# Patient Record
Sex: Male | Born: 1970 | Hispanic: Yes | Marital: Married | Smoking: Never smoker
Health system: Southern US, Community
[De-identification: ages and names within clinical notes are randomized; demographics above are authoritative.]

---

## 2016-03-13 ENCOUNTER — Emergency Department (HOSPITAL_COMMUNITY): Payer: Self-pay

## 2016-03-13 ENCOUNTER — Emergency Department (HOSPITAL_COMMUNITY)
Admission: EM | Admit: 2016-03-13 | Discharge: 2016-03-14 | Disposition: A | Discharge status: Home / Self Care | Payer: Self-pay | Attending: Emergency Medicine | Admitting: Emergency Medicine

## 2016-03-13 ENCOUNTER — Encounter (HOSPITAL_COMMUNITY): Payer: Self-pay | Admitting: Emergency Medicine

## 2016-03-13 DIAGNOSIS — R109 Unspecified abdominal pain: Secondary | ICD-10-CM

## 2016-03-13 DIAGNOSIS — N3000 Acute cystitis without hematuria: Secondary | ICD-10-CM | POA: Insufficient documentation

## 2016-03-13 LAB — COMPREHENSIVE METABOLIC PANEL
ALBUMIN: 4.8 g/dL (ref 3.5–5.0)
ALK PHOS: 87 U/L (ref 38–126)
ALT: 27 U/L (ref 17–63)
AST: 43 U/L — AB (ref 15–41)
Anion gap: 10 (ref 5–15)
BILIRUBIN TOTAL: 1.1 mg/dL (ref 0.3–1.2)
BUN: 38 mg/dL — AB (ref 6–20)
CO2: 22 mmol/L (ref 22–32)
CREATININE: 1.95 mg/dL — AB (ref 0.61–1.24)
Calcium: 10.3 mg/dL (ref 8.9–10.3)
Chloride: 101 mmol/L (ref 101–111)
GFR calc Af Amer: 45 mL/min — ABNORMAL LOW (ref >60)
GFR, EST NON AFRICAN AMERICAN: 39 mL/min — AB (ref >60)
GLUCOSE: 112 mg/dL — AB (ref 65–99)
POTASSIUM: 4.3 mmol/L (ref 3.5–5.1)
Sodium: 133 mmol/L — ABNORMAL LOW (ref 135–145)
TOTAL PROTEIN: 8.8 g/dL — AB (ref 6.5–8.1)

## 2016-03-13 LAB — CBC
HEMATOCRIT: 51.2 % (ref 39.0–52.0)
Hemoglobin: 17.5 g/dL — ABNORMAL HIGH (ref 13.0–17.0)
MCH: 29.2 pg (ref 26.0–34.0)
MCHC: 34.2 g/dL (ref 30.0–36.0)
MCV: 85.5 fL (ref 78.0–100.0)
PLATELETS: 340 10*3/uL (ref 150–400)
RBC: 5.99 MIL/uL — ABNORMAL HIGH (ref 4.22–5.81)
RDW: 13 % (ref 11.5–15.5)
WBC: 10.2 10*3/uL (ref 4.0–10.5)

## 2016-03-13 LAB — URINE MICROSCOPIC-ADD ON

## 2016-03-13 LAB — LIPASE, BLOOD: Lipase: 26 U/L (ref 11–51)

## 2016-03-13 LAB — URINALYSIS, ROUTINE W REFLEX MICROSCOPIC
GLUCOSE, UA: NEGATIVE mg/dL
Hgb urine dipstick: NEGATIVE
KETONES UR: NEGATIVE mg/dL
Leukocytes, UA: NEGATIVE
NITRITE: NEGATIVE
PH: 5.5 (ref 5.0–8.0)
PROTEIN: 30 mg/dL — AB
Specific Gravity, Urine: 1.031 — ABNORMAL HIGH (ref 1.005–1.030)

## 2016-03-13 LAB — I-STAT TROPONIN, ED: Troponin i, poc: 0 ng/mL (ref 0.00–0.08)

## 2016-03-13 MED ORDER — HYDROMORPHONE HCL 1 MG/ML IJ SOLN
1.0000 mg | Freq: Once | INTRAMUSCULAR | Status: AC
  Administered 2016-03-13: 1 mg via INTRAVENOUS
  Filled 2016-03-13: qty 1

## 2016-03-13 MED ORDER — DEXTROSE 5 % IV SOLN
1.0000 g | Freq: Once | INTRAVENOUS | Status: AC
  Administered 2016-03-13: 1 g via INTRAVENOUS
  Filled 2016-03-13: qty 10

## 2016-03-13 NOTE — ED Triage Notes (Signed)
Rt sided abd pain x 3 days  No vomiting or diarrhea ,  Denies dysuria

## 2016-03-14 MED ORDER — CIPROFLOXACIN HCL 500 MG PO TABS: 500.0000 mg | ORAL_TABLET | Freq: Two times a day (BID) | ORAL | 0 refills | Status: AC

## 2016-03-14 MED ORDER — CIPROFLOXACIN HCL 500 MG PO TABS: 500.0000 mg | ORAL_TABLET | Freq: Two times a day (BID) | ORAL | 0 refills | Status: DC

## 2016-03-14 NOTE — ED Provider Notes (Addendum)
[facility]-EMERGENCY DEPT Provider Note   CSN: [id number] Arrival date & time: 03/13/16  1403     History   Chief Complaint Chief Complaint  Patient presents with  . Abdominal Pain    HPI Xavier Maxwell is a 45 y.o. male.   Abdominal Pain     Patient presents the emergency department with worsening right-sided abdominal pain with some radiation towards his right flank today.  He's adamantly had these symptoms.  He does report some dysuria without urinary frequency.  No history of urinary tract infection.  Denies vomiting and diarrhea.  No history of kidney stones.  No fevers or chills.  No chest pain or shortness of breath.  Pain is moderate in severity.     History reviewed. No pertinent past medical history.  There are no active problems to display for this patient.   History reviewed. No pertinent surgical history.     Home Medications    Prior to Admission medications   Medication Sig Start Date End Date Taking? Authorizing Provider  acetaminophen (TYLENOL) 500 MG tablet Take 500-1,000 mg by mouth every 6 (six) hours as needed for mild pain or moderate pain.   Yes Historical Provider, MD  ciprofloxacin (CIPRO) 500 MG tablet Take 1 tablet (500 mg total) by mouth 2 (two) times daily. 03/14/16   Azalia Bilis, MD    Family History No family history on file.  Social History Social History  Substance Use Topics  . Smoking status: Never Smoker  . Smokeless tobacco: Never Used  . Alcohol use Not on file     Allergies   Review of patient's allergies indicates no known allergies.   Review of Systems Review of Systems  Gastrointestinal: Positive for abdominal pain.  All other systems reviewed and are negative.    Physical Exam Updated Vital Signs BP 127/85   Pulse 76   Temp 98 F (36.7 C) (Oral)   Resp 12   Ht 5\' 6"  (1.676 m)   Wt 180 lb (81.6 kg)   SpO2 98%   BMI 29.05 kg/m   Physical Exam  Constitutional: He is oriented to person, place, and time.  He appears well-developed and well-nourished.  HENT:  Head: Normocephalic and atraumatic.  Eyes: EOM are normal.  Neck: Normal range of motion.  Cardiovascular: Normal rate, regular rhythm, normal heart sounds and intact distal pulses.   Pulmonary/Chest: Effort normal and breath sounds normal. No respiratory distress.  Abdominal: Soft. He exhibits no distension.  Mild right-sided abdominal tenderness without guarding or rebound  Musculoskeletal: Normal range of motion.  Neurological: He is alert and oriented to person, place, and time.  Skin: Skin is warm and dry.  Psychiatric: He has a normal mood and affect. Judgment normal.  Nursing note and vitals reviewed.    ED Treatments / Results  Labs (all labs ordered are listed, but only abnormal results are displayed) Labs Reviewed  COMPREHENSIVE METABOLIC PANEL - Abnormal; Notable for the following:       Result Value   Sodium 133 (*)    Glucose, Bld 112 (*)    BUN 38 (*)    Creatinine, Ser 1.95 (*)    Total Protein 8.8 (*)    AST 43 (*)    GFR calc non Af Amer 39 (*)    GFR calc Af Amer 45 (*)    All other components within normal limits  CBC - Abnormal; Notable for the following:    RBC 5.99 (*)    Hemoglobin 17.5 (*)  All other components within normal limits  URINALYSIS, ROUTINE W REFLEX MICROSCOPIC (NOT AT [facility]) - Abnormal; Notable for the following:    APPearance CLOUDY (*)    Specific Gravity, Urine 1.031 (*)    Bilirubin Urine SMALL (*)    Protein, ur 30 (*)    All other components within normal limits  URINE MICROSCOPIC-ADD ON - Abnormal; Notable for the following:    Squamous Epithelial / LPF 0-5 (*)    Bacteria, UA RARE (*)    Casts HYALINE CASTS (*)    All other components within normal limits  URINE CULTURE  LIPASE, BLOOD  I-STAT TROPOININ, ED    EKG  EKG Interpretation  Date/Time:  Tuesday March 13 2016 14:25:40 EDT Ventricular Rate:  92 PR Interval:  142 QRS Duration: 96 QT  Interval:  356 QTC Calculation: 440 R Axis:   76 Text Interpretation:  Normal sinus rhythm Normal ECG No old tracing to compare Confirmed by Nyisha Clippard  MD, Caryn BeeKEVIN ([id number]) on 03/13/2016 8:31:14 PM       Radiology Ct Renal Stone Study  Result Date: 03/13/2016 CLINICAL DATA:  Right flank pain for the last month. Worsening over the last 2-3 days. EXAM: CT ABDOMEN AND PELVIS WITHOUT CONTRAST TECHNIQUE: Multidetector CT imaging of the abdomen and pelvis was performed following the standard protocol without IV contrast. COMPARISON:  None. FINDINGS: Lower chest: Lung bases are clear. No pleural or pericardial fluid. Insignificant granuloma in the right middle lobe. Hepatobiliary: The liver is normal without contrast. No calcified gallstones. Pancreas: Normal Spleen: Normal Adrenals/Urinary Tract: Adrenal glands are normal. Kidneys are normal. No cyst, mass, stone or hydronephrosis. Stomach/Bowel: Normal. Normal appearing appendix. No evidence of diverticulosis or diverticulitis. Vascular/Lymphatic: Normal aorta and IVC. No retroperitoneal adenopathy. Reproductive: Normal Other: None. 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 IMPRESSION: No cause of acute pain identified. No abdominal organ pathology. 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 Electronically Signed   By: Paulina FusiMark  Shogry M.D.   On: 03/13/2016 21:56    Procedures Procedures (including critical care time)  Medications Ordered in ED Medications  cefTRIAXone (ROCEPHIN) 1 g in dextrose 5 % 50 mL IVPB (0 g Intravenous Stopped 03/13/16 2352)  HYDROmorphone (DILAUDID) injection 1 mg (1 mg Intravenous Given 03/13/16 2105)     Initial Impression / Assessment and Plan / ED Course  I have reviewed the triage vital signs and the nursing notes.  Pertinent labs & imaging results that were available during my care of the patient were reviewed by me and considered in my medical decision making  (see chart for details).  Clinical Course    Patient is overall well-appearing.  CT scan of his abdomen demonstrates no ureterolithiasis.  He does have a urinary tract infection and he has had some decreased oral intake today.  I suspect this is why the mild elevation in his BUN and creatinine.  No baseline studies available.  IV fluids.  IV antibiotics.  Urine culture sent.  [facility]  He understands to return to the ER for new or worsening symptoms.  Feels much better at this time   Final Clinical Impressions(s) / ED Diagnoses   Final diagnoses:  Abdominal pain, unspecified abdominal location  Acute cystitis without hematuria    New Prescriptions New Prescriptions   CIPROFLOXACIN (CIPRO) 500 MG TABLET    Take 1 tablet (500 mg total) by mouth 2 (two) times daily.  Azalia Bilis, MD 03/14/16 9604    Azalia Bilis, MD 05/10/16 [phone]

## 2016-03-15 LAB — URINE CULTURE: Culture: <10000 — AB

## 2018-02-25 IMAGING — CT CT RENAL STONE PROTOCOL
2 of 4 series · 10 of 46 positions shown, 11 images · non-contrast
Comparison: None.

CLINICAL DATA: Right flank pain for the last month. Worsening over
the last 2-3 days.

EXAM:
CT ABDOMEN AND PELVIS WITHOUT CONTRAST
TECHNIQUE: Multidetector CT imaging of the abdomen and pelvis was performed
following the standard protocol without IV contrast.

[Series 201: stone study, idose (2) · axial · 0.85mm/px · z∈[+347,+757]mm · 7 of 100 slices shown, 8 images]
[im 9/100  soft-tissue]
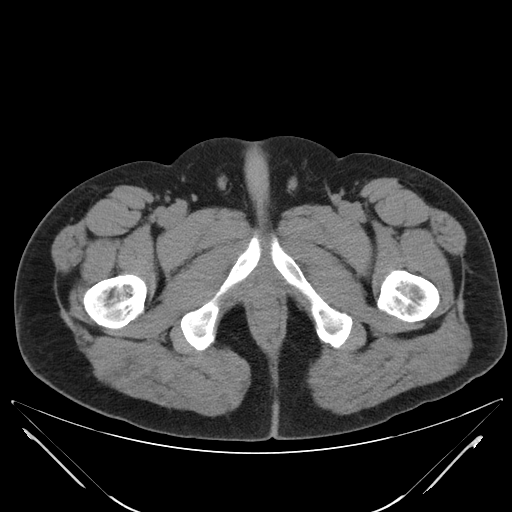
[im 9/100  bone]
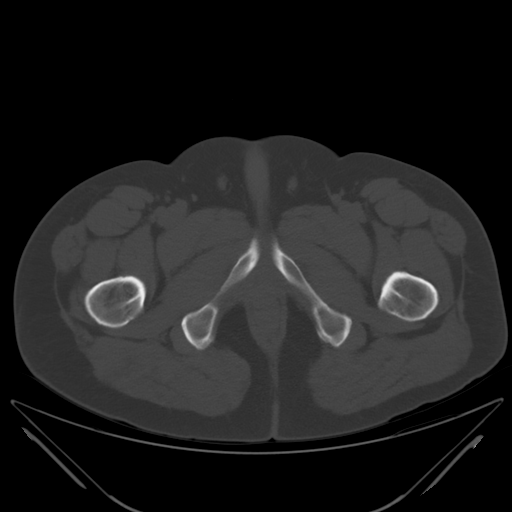
[im 22/100  soft-tissue]
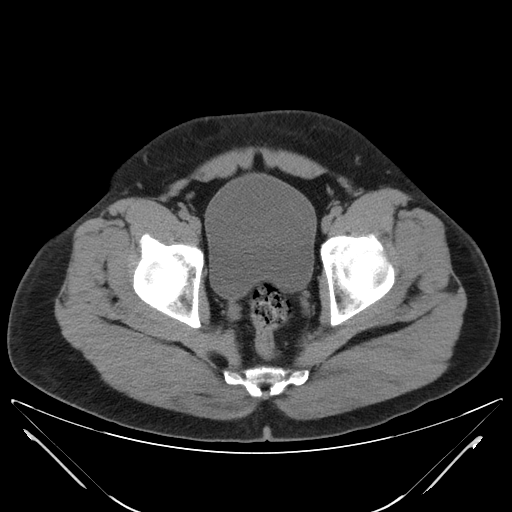
[im 35/100  soft-tissue]
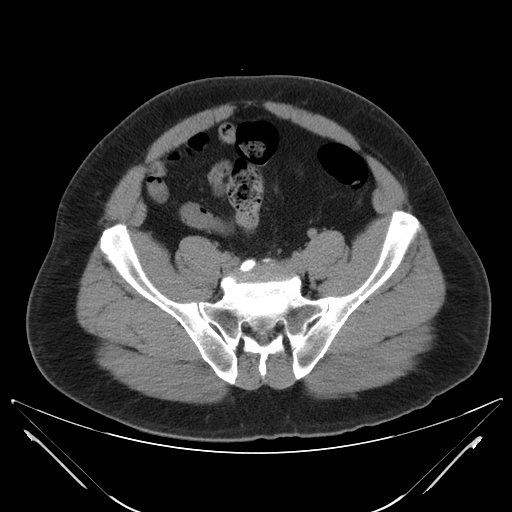
[im 52/100  soft-tissue]
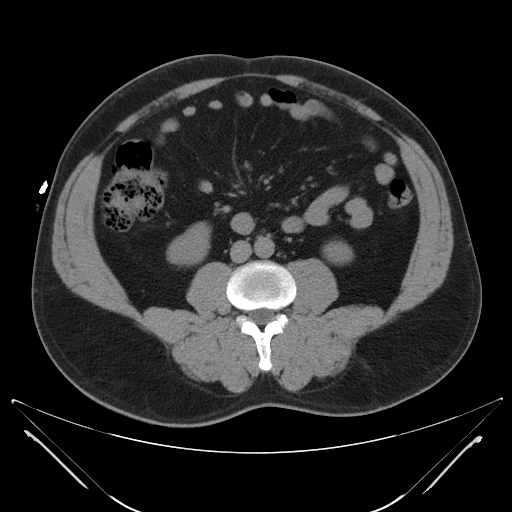
[im 65/100  soft-tissue]
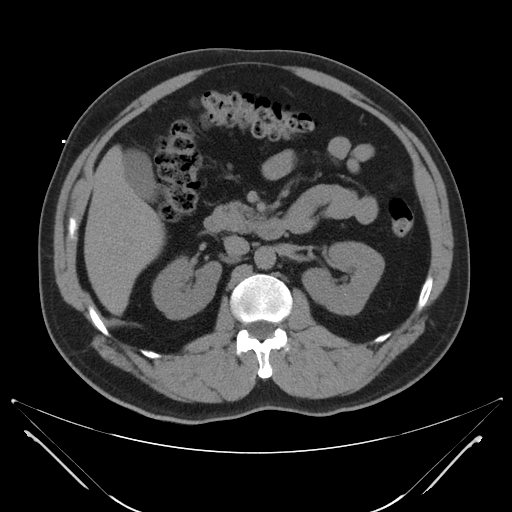
[im 78/100  soft-tissue]
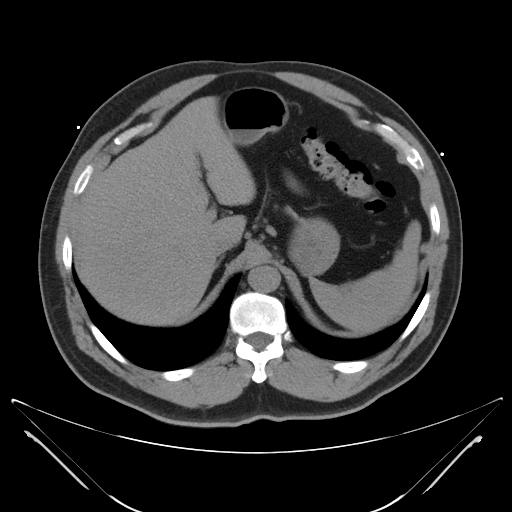
[im 91/100  soft-tissue]
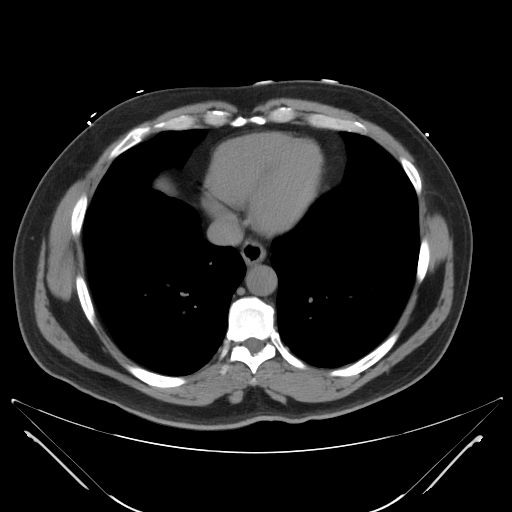

[Series 203: coronals, idose (2) · coronal · 0.45mm/px · 3 of 145 slices shown]
[im 49/145  soft-tissue]
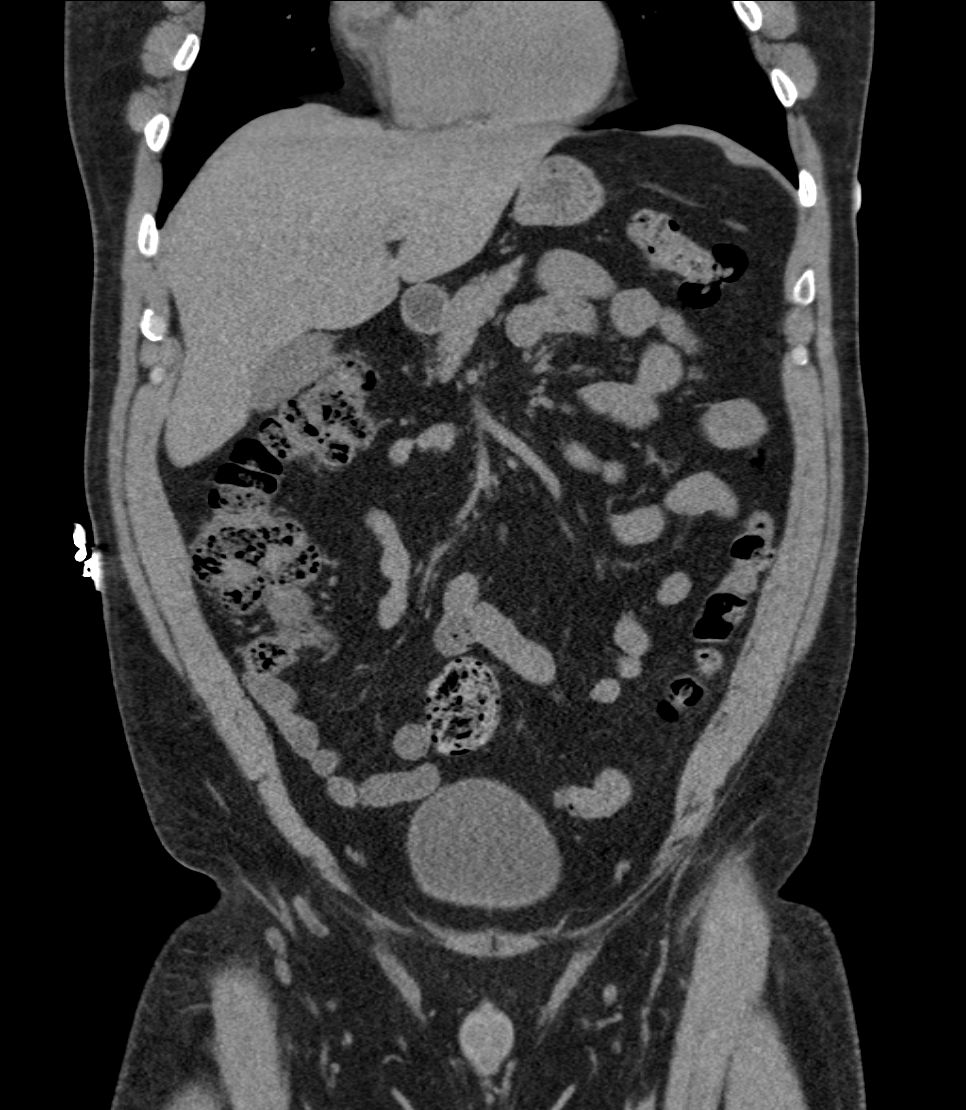
[im 65/145  soft-tissue]
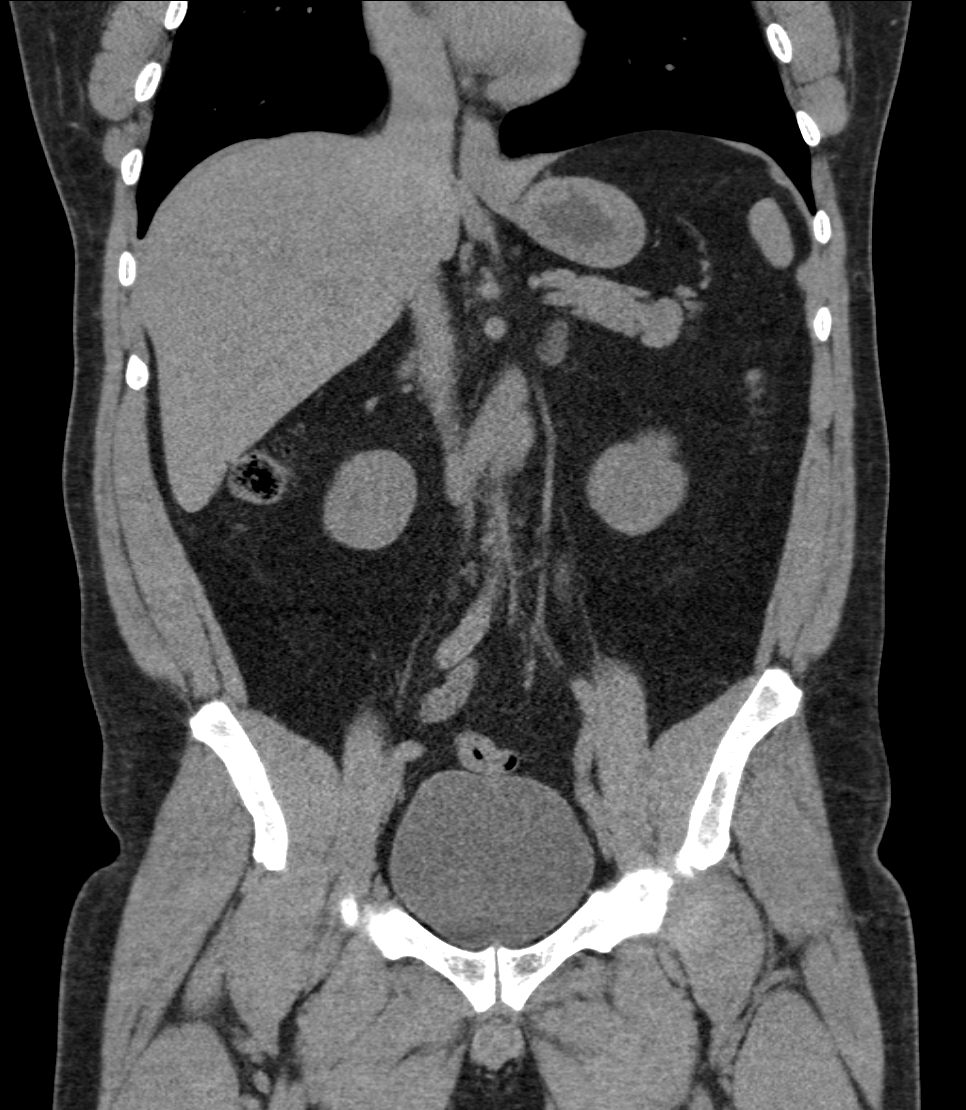
[im 81/145  soft-tissue]
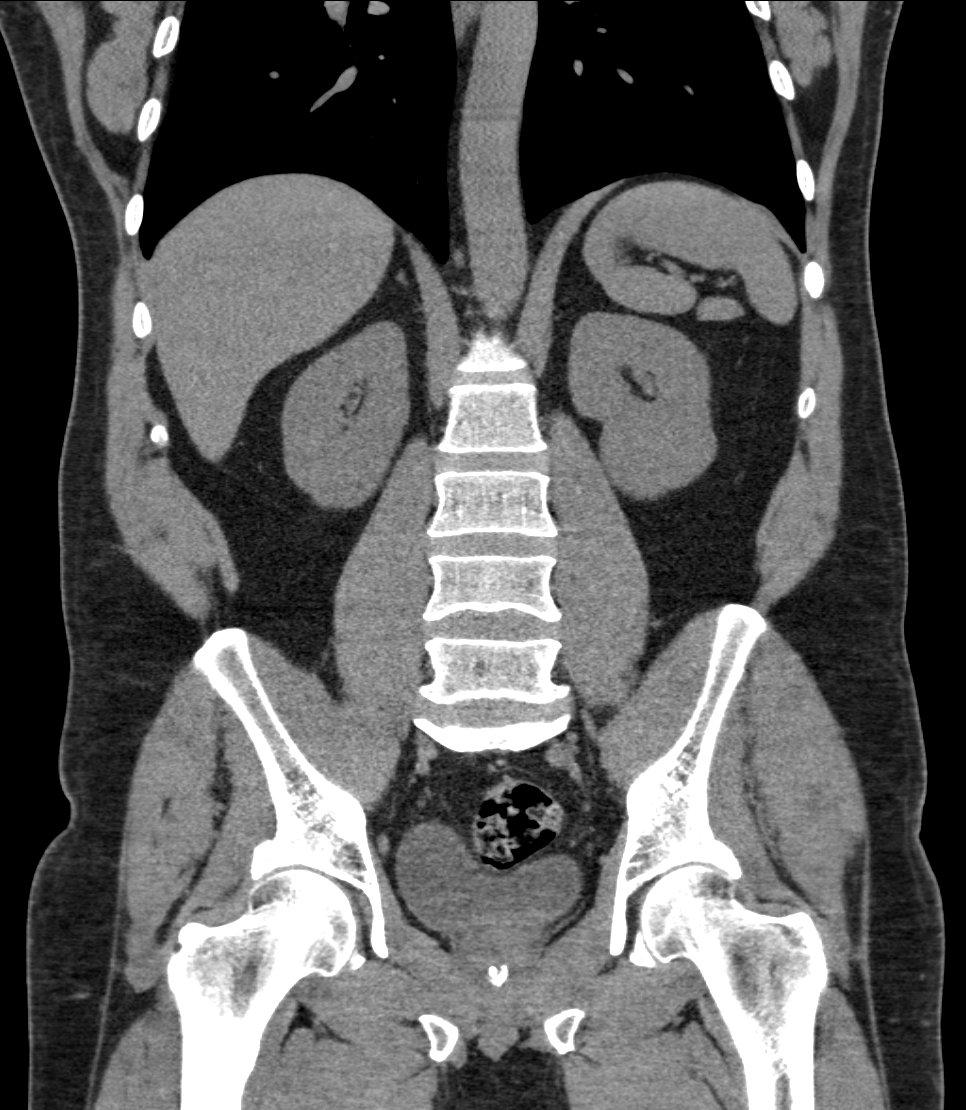

[10 of 46 positions shown; findings below may reference images not displayed]

FINDINGS: Lower chest: Lung bases are clear. No pleural or pericardial fluid.
Insignificant granuloma in the right middle lobe.

Hepatobiliary: The liver is normal without contrast. No calcified
gallstones.

Pancreas: Normal

Spleen: Normal

Adrenals/Urinary Tract: Adrenal glands are normal. Kidneys are
normal. No cyst, mass, stone or hydronephrosis.

Stomach/Bowel: Normal. Normal appearing appendix. No evidence of
diverticulosis or diverticulitis.

Vascular/Lymphatic: Normal aorta and IVC. No retroperitoneal
adenopathy.

Reproductive: Normal

Other: None.

Musculoskeletal: Chronic bilateral pars defects at L5. Chronic
degenerative disc disease L5-S1.
IMPRESSION: No cause of acute pain identified. No abdominal organ pathology.
Chronic bilateral pars defects at L5 incidentally noted.
